# Patient Record
Sex: Male | Born: 2000 | Race: Black or African American | Hispanic: No | Marital: Single
Health system: Southern US, Community
[De-identification: ages and names within clinical notes are randomized; demographics above are authoritative.]

---
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
## 2022-04-20 IMAGING — CR DG WRIST COMPLETE 3+V*R*
4 series · 4 of 4 positions shown · non-contrast
Comparison: None.

CLINICAL DATA: Recent fall with wrist pain, initial encounter

EXAM:
RIGHT WRIST - COMPLETE 3+ VIEW

[x wrist pa right]
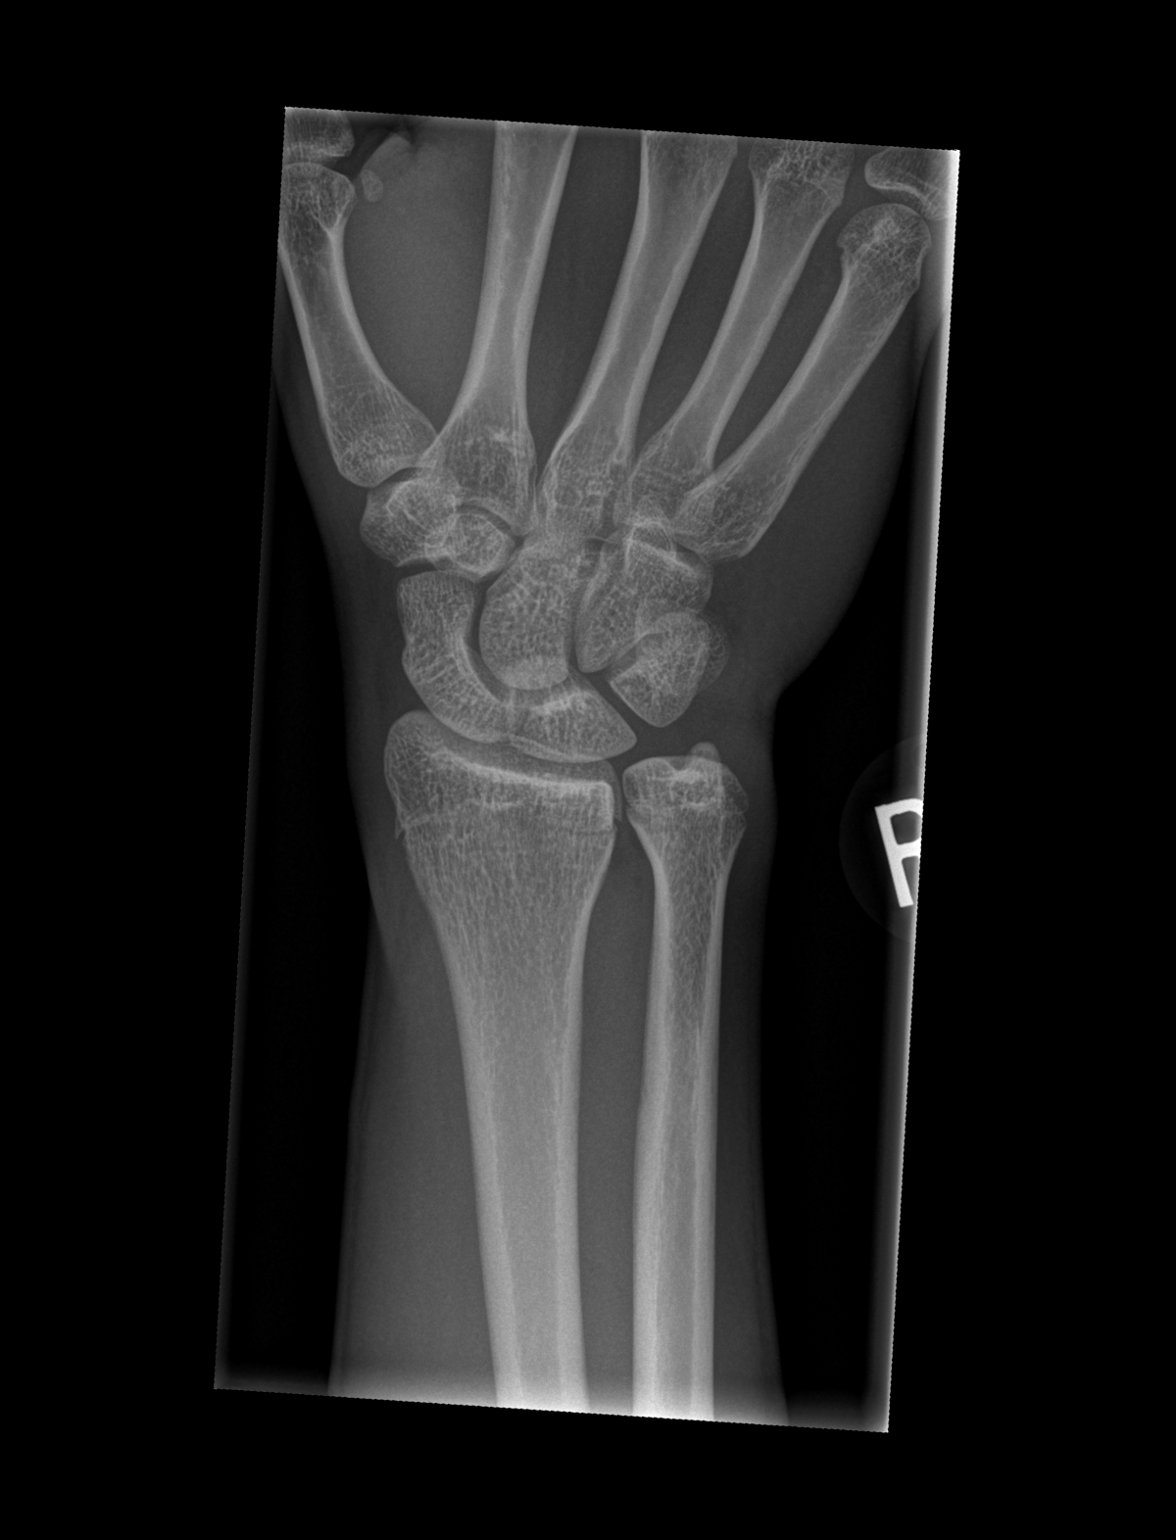

[x wrist obl right]
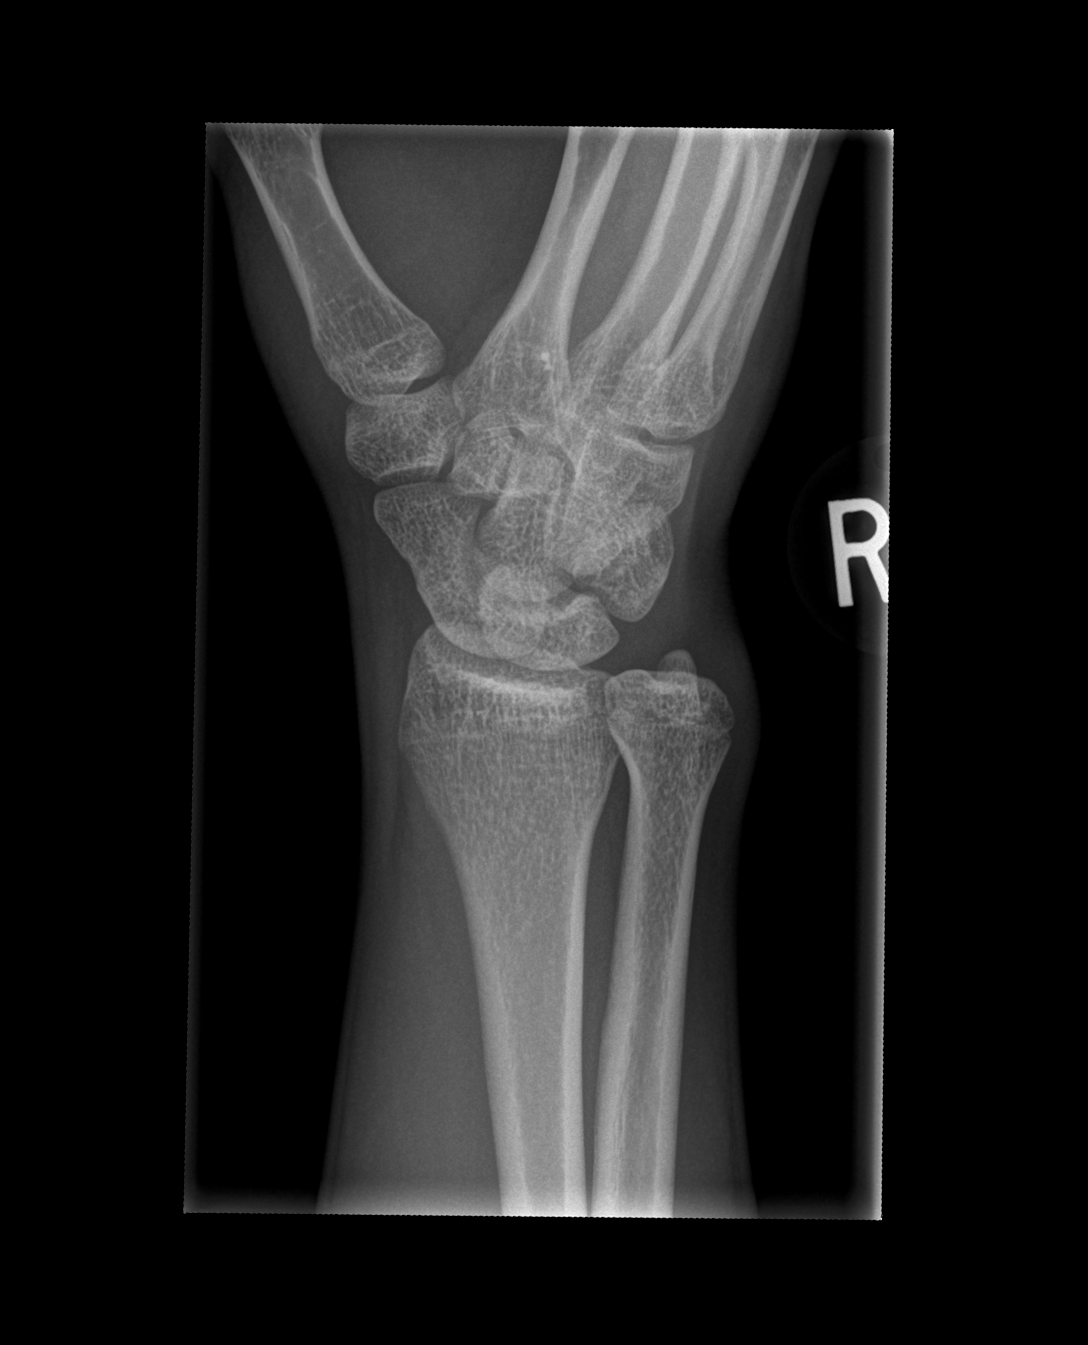

[x wrist lat right]
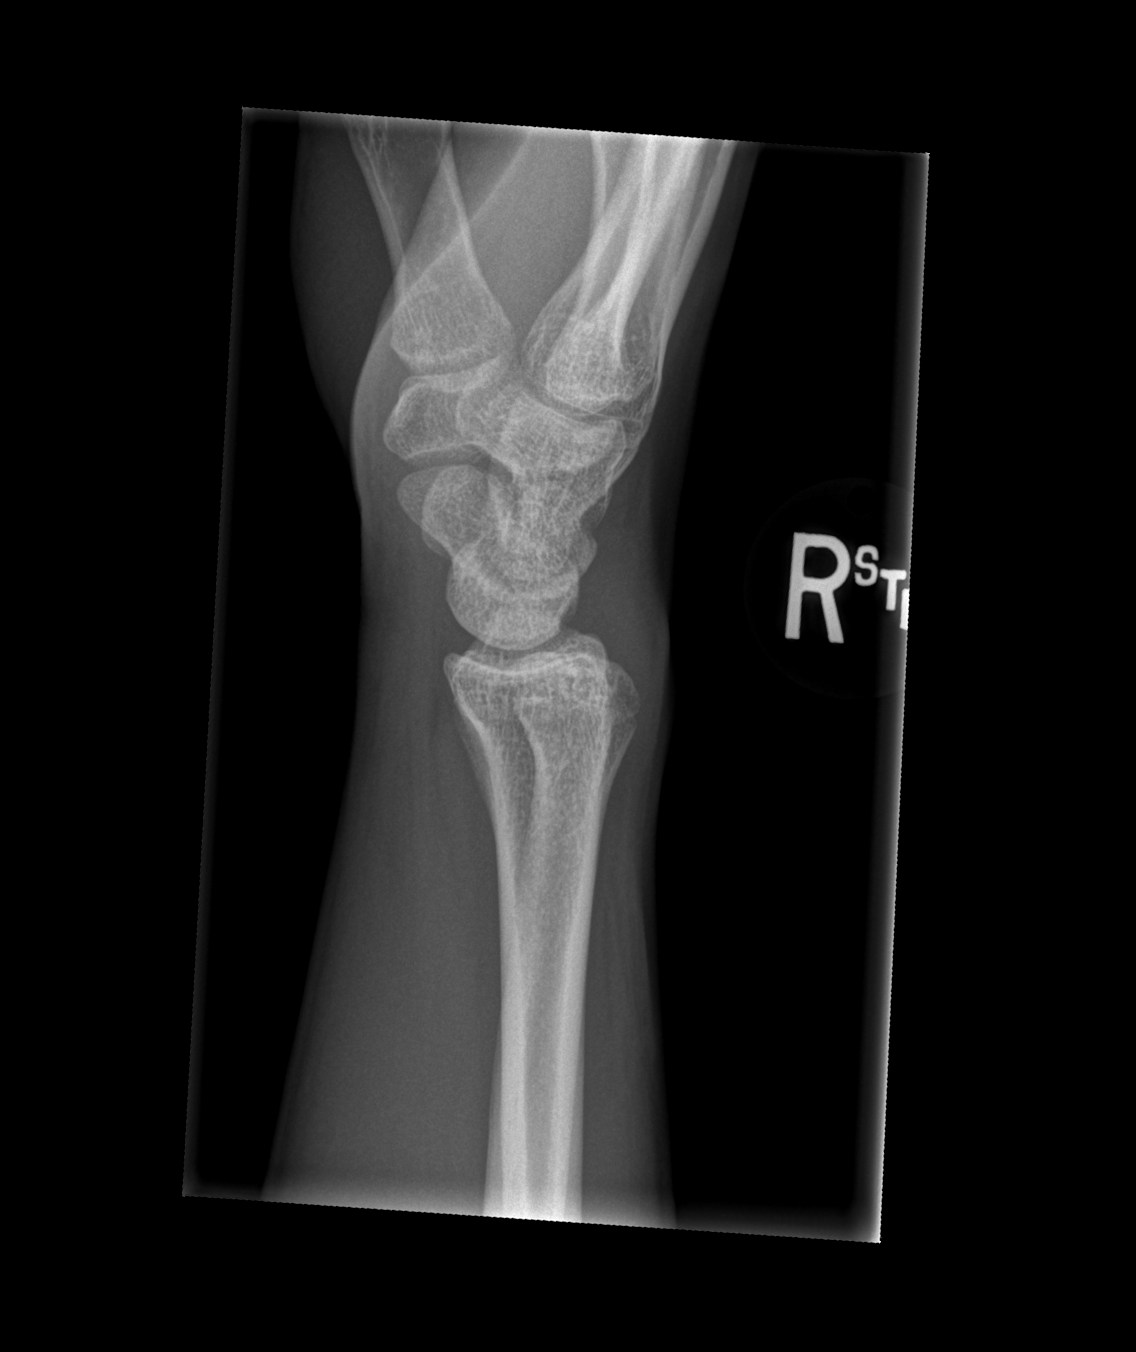

[x wrist navicular view right]
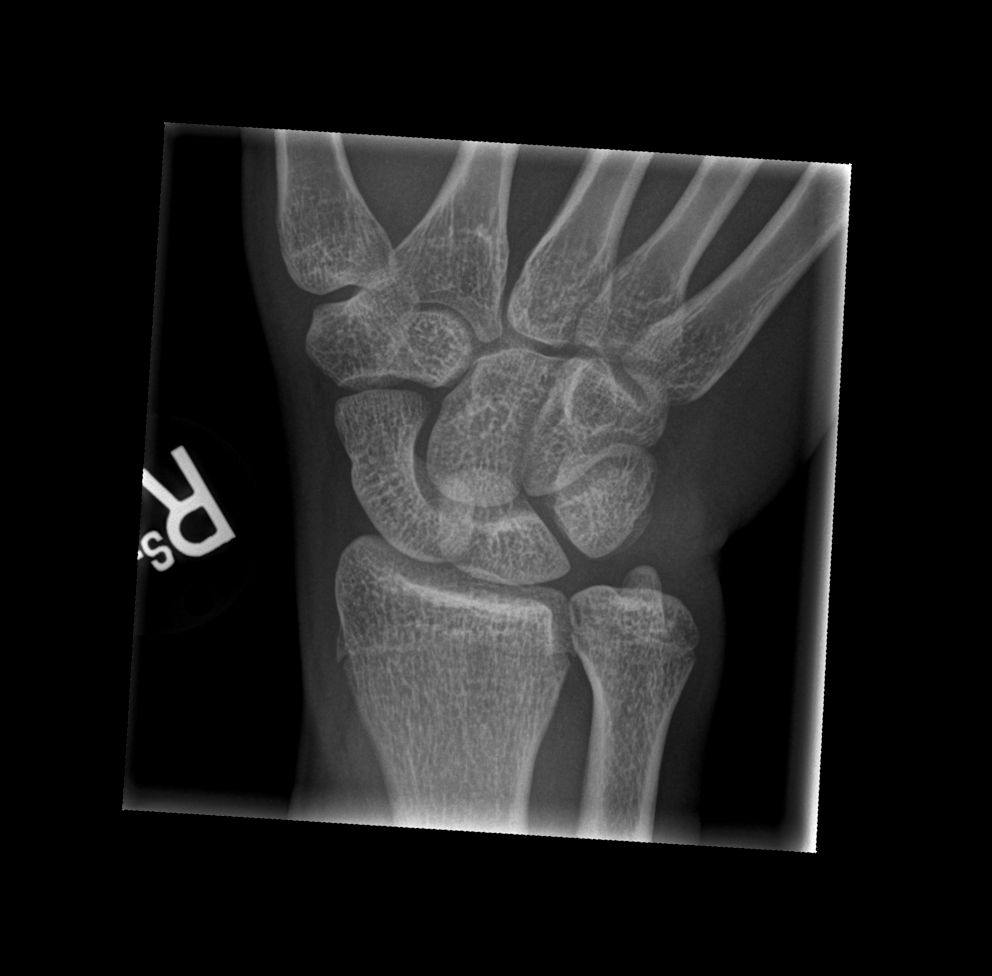

[4 of 4 positions shown; findings below may reference images not displayed]

FINDINGS: Patent growth plates are noted in the distal radius and ulna. No
definitive fracture or dislocation is seen. No gross soft tissue
abnormality is seen.
IMPRESSION: Open growth plates distally without focal abnormality.

## 2023-04-14 ENCOUNTER — Other Ambulatory Visit: Payer: Self-pay

## 2023-04-14 ENCOUNTER — Emergency Department (HOSPITAL_COMMUNITY): Payer: Medicaid Other

## 2023-04-14 ENCOUNTER — Emergency Department (HOSPITAL_COMMUNITY)
Admission: EM | Admit: 2023-04-14 | Discharge: 2023-04-15 | Disposition: A | Discharge status: Home / Self Care | Payer: Medicaid Other | Attending: Emergency Medicine | Admitting: Emergency Medicine

## 2023-04-14 DIAGNOSIS — S40212A Abrasion of left shoulder, initial encounter: Secondary | ICD-10-CM | POA: Insufficient documentation

## 2023-04-14 DIAGNOSIS — T07XXXA Unspecified multiple injuries, initial encounter: Secondary | ICD-10-CM

## 2023-04-14 DIAGNOSIS — S70212A Abrasion, left hip, initial encounter: Secondary | ICD-10-CM | POA: Diagnosis present

## 2023-04-14 DIAGNOSIS — S60512A Abrasion of left hand, initial encounter: Secondary | ICD-10-CM | POA: Diagnosis not present

## 2023-04-14 DIAGNOSIS — Y92410 Unspecified street and highway as the place of occurrence of the external cause: Secondary | ICD-10-CM | POA: Diagnosis not present

## 2023-04-14 DIAGNOSIS — S80211A Abrasion, right knee, initial encounter: Secondary | ICD-10-CM | POA: Diagnosis not present

## 2023-04-14 DIAGNOSIS — S80212A Abrasion, left knee, initial encounter: Secondary | ICD-10-CM | POA: Insufficient documentation

## 2023-04-14 DIAGNOSIS — S40211A Abrasion of right shoulder, initial encounter: Secondary | ICD-10-CM | POA: Insufficient documentation

## 2023-04-14 LAB — I-STAT CHEM 8, ED
BUN: 11 mg/dL (ref 6–20)
Calcium, Ion: 1.09 mmol/L — ABNORMAL LOW (ref 1.15–1.40)
Chloride: 104 mmol/L (ref 98–111)
Creatinine, Ser: 1.1 mg/dL (ref 0.61–1.24)
Glucose, Bld: 130 mg/dL — ABNORMAL HIGH (ref 70–99)
HCT: 49 % (ref 39.0–52.0)
Hemoglobin: 16.7 g/dL (ref 13.0–17.0)
Potassium: 3.7 mmol/L (ref 3.5–5.1)
Sodium: 140 mmol/L (ref 135–145)
TCO2: 25 mmol/L (ref 22–32)

## 2023-04-14 LAB — COMPREHENSIVE METABOLIC PANEL
ALT: 10 U/L (ref 0–44)
AST: 37 U/L (ref 15–41)
Albumin: 4.5 g/dL (ref 3.5–5.0)
Alkaline Phosphatase: 55 U/L (ref 38–126)
Anion gap: 12 (ref 5–15)
BUN: 8 mg/dL (ref 6–20)
CO2: 22 mmol/L (ref 22–32)
Calcium: 9.3 mg/dL (ref 8.9–10.3)
Chloride: 103 mmol/L (ref 98–111)
Creatinine, Ser: 1.23 mg/dL (ref 0.61–1.24)
GFR, Estimated: >60 mL/min (ref >60)
Glucose, Bld: 132 mg/dL — ABNORMAL HIGH (ref 70–99)
Potassium: 3.7 mmol/L (ref 3.5–5.1)
Sodium: 137 mmol/L (ref 135–145)
Total Bilirubin: 1.1 mg/dL (ref 0.3–1.2)
Total Protein: 7.6 g/dL (ref 6.5–8.1)

## 2023-04-14 LAB — CBC
HCT: 46.6 % (ref 39.0–52.0)
Hemoglobin: 15.9 g/dL (ref 13.0–17.0)
MCH: 29.6 pg (ref 26.0–34.0)
MCHC: 34.1 g/dL (ref 30.0–36.0)
MCV: 86.8 fL (ref 80.0–100.0)
Platelets: 227 10*3/uL (ref 150–400)
RBC: 5.37 MIL/uL (ref 4.22–5.81)
RDW: 11.5 % (ref 11.5–15.5)
WBC: 12.2 10*3/uL — ABNORMAL HIGH (ref 4.0–10.5)
nRBC: 0 % (ref 0.0–0.2)

## 2023-04-14 LAB — PROTIME-INR
INR: 1 (ref 0.8–1.2)
Prothrombin Time: 13.8 seconds (ref 11.4–15.2)

## 2023-04-14 LAB — ETHANOL: Alcohol, Ethyl (B): <10 mg/dL (ref <10)

## 2023-04-14 LAB — SAMPLE TO BLOOD BANK

## 2023-04-14 LAB — I-STAT CG4 LACTIC ACID, ED: Lactic Acid, Venous: 1.8 mmol/L (ref 0.5–1.9)

## 2023-04-14 MED ORDER — LIDOCAINE HCL URETHRAL/MUCOSAL 2 % EX GEL
1.0000 | Freq: Once | CUTANEOUS | Status: AC
  Administered 2023-04-14: 1 via TOPICAL
  Filled 2023-04-14: qty 22

## 2023-04-14 MED ORDER — CEFAZOLIN SODIUM-DEXTROSE 2-4 GM/100ML-% IV SOLN
2.0000 g | Freq: Once | INTRAVENOUS | Status: AC
  Administered 2023-04-14: 2 g via INTRAVENOUS

## 2023-04-14 MED ORDER — CEPHALEXIN 500 MG PO CAPS: 500.0000 mg | ORAL_CAPSULE | Freq: Four times a day (QID) | ORAL | 0 refills | Status: DC

## 2023-04-14 MED ORDER — IOHEXOL 350 MG/ML SOLN
75.0000 mL | Freq: Once | INTRAVENOUS | Status: AC | PRN
  Administered 2023-04-14: 75 mL via INTRAVENOUS

## 2023-04-14 MED ORDER — HYDROMORPHONE HCL 1 MG/ML IJ SOLN
1.0000 mg | INTRAMUSCULAR | Status: AC | PRN
  Administered 2023-04-14 – 2023-04-15 (×3): 1 mg via INTRAVENOUS
  Filled 2023-04-14 (×2): qty 1

## 2023-04-14 MED ORDER — SODIUM CHLORIDE 0.9 % IV BOLUS
1000.0000 mL | Freq: Once | INTRAVENOUS | Status: AC
  Administered 2023-04-14: 1000 mL via INTRAVENOUS

## 2023-04-14 MED ORDER — NAPROXEN 375 MG PO TABS: 375.0000 mg | ORAL_TABLET | Freq: Two times a day (BID) | ORAL | 0 refills | Status: DC

## 2023-04-14 MED ORDER — SODIUM CHLORIDE 0.9 % IV SOLN: INTRAVENOUS | Status: DC

## 2023-04-14 MED ORDER — HYDROMORPHONE HCL 1 MG/ML IJ SOLN
INTRAMUSCULAR | Status: AC
  Filled 2023-04-14: qty 1

## 2023-04-14 MED ORDER — OXYCODONE-ACETAMINOPHEN 5-325 MG PO TABS: 1.0000 | ORAL_TABLET | Freq: Four times a day (QID) | ORAL | 0 refills | Status: DC | PRN

## 2023-04-14 MED ORDER — ONDANSETRON HCL 4 MG/2ML IJ SOLN
4.0000 mg | Freq: Once | INTRAMUSCULAR | Status: AC
  Administered 2023-04-14: 4 mg via INTRAVENOUS
  Filled 2023-04-14: qty 2
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
## 2023-04-14 NOTE — ED Notes (Signed)
Patient transported to CT 
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
## 2023-04-14 NOTE — Discharge Instructions (Addendum)
Follow-up at the wound care center to assist with treatment of your wounds.  Continue to apply antibiotic ointment and sterile dressings to the wound daily.  Take the medications as needed for pain.  Consider following up with orthopedic doctor if you have any persistent joint or extremity pain
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
## 2023-05-02 ENCOUNTER — Ambulatory Visit: Payer: Medicaid Other | Admitting: Physician Assistant

## 2023-05-03 ENCOUNTER — Ambulatory Visit (HOSPITAL_BASED_OUTPATIENT_CLINIC_OR_DEPARTMENT_OTHER): Payer: Medicaid Other | Admitting: Orthopaedic Surgery

## 2023-05-03 DIAGNOSIS — T148XXA Other injury of unspecified body region, initial encounter: Secondary | ICD-10-CM
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
## 2023-05-08 ENCOUNTER — Ambulatory Visit: Payer: Medicaid Other | Admitting: Internal Medicine

## 2023-10-23 ENCOUNTER — Encounter (HOSPITAL_COMMUNITY): Payer: Self-pay

## 2023-10-23 ENCOUNTER — Other Ambulatory Visit: Payer: Self-pay

## 2023-10-23 ENCOUNTER — Emergency Department (HOSPITAL_COMMUNITY)

## 2023-10-23 ENCOUNTER — Observation Stay (HOSPITAL_COMMUNITY)
Admission: EM | Admit: 2023-10-23 | Discharge: 2023-10-24 | Disposition: A | Discharge status: Home / Self Care | Attending: Surgery | Admitting: Surgery

## 2023-10-23 DIAGNOSIS — S27322A Contusion of lung, bilateral, initial encounter: Secondary | ICD-10-CM | POA: Diagnosis not present

## 2023-10-23 DIAGNOSIS — T1490XA Injury, unspecified, initial encounter: Secondary | ICD-10-CM | POA: Diagnosis present

## 2023-10-23 DIAGNOSIS — M62059 Separation of muscle (nontraumatic), unspecified thigh: Secondary | ICD-10-CM | POA: Diagnosis not present

## 2023-10-23 DIAGNOSIS — S61211A Laceration without foreign body of left index finger without damage to nail, initial encounter: Secondary | ICD-10-CM | POA: Insufficient documentation

## 2023-10-23 DIAGNOSIS — S59292A Other physeal fracture of lower end of radius, left arm, initial encounter for closed fracture: Secondary | ICD-10-CM | POA: Diagnosis not present

## 2023-10-23 DIAGNOSIS — R109 Unspecified abdominal pain: Secondary | ICD-10-CM | POA: Diagnosis not present

## 2023-10-23 DIAGNOSIS — R519 Headache, unspecified: Secondary | ICD-10-CM | POA: Diagnosis present

## 2023-10-23 DIAGNOSIS — Z7722 Contact with and (suspected) exposure to environmental tobacco smoke (acute) (chronic): Secondary | ICD-10-CM | POA: Diagnosis not present

## 2023-10-23 LAB — I-STAT CHEM 8, ED
BUN: 11 mg/dL (ref 6–20)
Calcium, Ion: 1.14 mmol/L — ABNORMAL LOW (ref 1.15–1.40)
Chloride: 101 mmol/L (ref 98–111)
Creatinine, Ser: 1.2 mg/dL (ref 0.61–1.24)
Glucose, Bld: 108 mg/dL — ABNORMAL HIGH (ref 70–99)
HCT: 45 % (ref 39.0–52.0)
Hemoglobin: 15.3 g/dL (ref 13.0–17.0)
Potassium: 3.5 mmol/L (ref 3.5–5.1)
Sodium: 140 mmol/L (ref 135–145)
TCO2: 28 mmol/L (ref 22–32)

## 2023-10-23 LAB — URINALYSIS, ROUTINE W REFLEX MICROSCOPIC
Bilirubin Urine: NEGATIVE
Glucose, UA: NEGATIVE mg/dL
Ketones, ur: NEGATIVE mg/dL
Leukocytes,Ua: NEGATIVE
Nitrite: NEGATIVE
Protein, ur: NEGATIVE mg/dL
RBC / HPF: >50 RBC/hpf (ref 0–5)
Specific Gravity, Urine: >1.046 — ABNORMAL HIGH (ref 1.005–1.030)
pH: 8 (ref 5.0–8.0)

## 2023-10-23 LAB — COMPREHENSIVE METABOLIC PANEL WITH GFR
ALT: 16 U/L (ref 0–44)
AST: 33 U/L (ref 15–41)
Albumin: 4.2 g/dL (ref 3.5–5.0)
Alkaline Phosphatase: 53 U/L (ref 38–126)
Anion gap: 10 (ref 5–15)
BUN: 10 mg/dL (ref 6–20)
CO2: 25 mmol/L (ref 22–32)
Calcium: 9.3 mg/dL (ref 8.9–10.3)
Chloride: 104 mmol/L (ref 98–111)
Creatinine, Ser: 1.22 mg/dL (ref 0.61–1.24)
GFR, Estimated: >60 mL/min (ref >60)
Glucose, Bld: 108 mg/dL — ABNORMAL HIGH (ref 70–99)
Potassium: 3.2 mmol/L — ABNORMAL LOW (ref 3.5–5.1)
Sodium: 139 mmol/L (ref 135–145)
Total Bilirubin: 0.8 mg/dL (ref 0.0–1.2)
Total Protein: 7.1 g/dL (ref 6.5–8.1)

## 2023-10-23 LAB — CBC
HCT: 45.6 % (ref 39.0–52.0)
Hemoglobin: 15.9 g/dL (ref 13.0–17.0)
MCH: 30.3 pg (ref 26.0–34.0)
MCHC: 34.9 g/dL (ref 30.0–36.0)
MCV: 87 fL (ref 80.0–100.0)
Platelets: 186 10*3/uL (ref 150–400)
RBC: 5.24 MIL/uL (ref 4.22–5.81)
RDW: 11.4 % — ABNORMAL LOW (ref 11.5–15.5)
WBC: 4.5 10*3/uL (ref 4.0–10.5)
nRBC: 0 % (ref 0.0–0.2)

## 2023-10-23 LAB — PROTIME-INR
INR: 1 (ref 0.8–1.2)
Prothrombin Time: 13.6 s (ref 11.4–15.2)

## 2023-10-23 LAB — I-STAT CG4 LACTIC ACID, ED: Lactic Acid, Venous: 1.6 mmol/L (ref 0.5–1.9)

## 2023-10-23 LAB — SAMPLE TO BLOOD BANK

## 2023-10-23 LAB — ETHANOL: Alcohol, Ethyl (B): <10 mg/dL (ref <10)

## 2023-10-23 MED ORDER — MORPHINE SULFATE (PF) 4 MG/ML IV SOLN
4.0000 mg | Freq: Once | INTRAVENOUS | Status: AC
  Administered 2023-10-23: 4 mg via INTRAVENOUS
  Filled 2023-10-23: qty 1

## 2023-10-23 MED ORDER — ACETAMINOPHEN 500 MG PO TABS
1000.0000 mg | ORAL_TABLET | Freq: Four times a day (QID) | ORAL | Status: DC
  Administered 2023-10-24 (×3): 1000 mg via ORAL
  Filled 2023-10-23 (×3): qty 2

## 2023-10-23 MED ORDER — OXYCODONE HCL 5 MG PO TABS: 5.0000 mg | ORAL_TABLET | ORAL | Status: DC | PRN

## 2023-10-23 MED ORDER — METHOCARBAMOL 1000 MG/10ML IJ SOLN
500.0000 mg | Freq: Three times a day (TID) | INTRAMUSCULAR | Status: DC
  Filled 2023-10-23: qty 10

## 2023-10-23 MED ORDER — HYDROMORPHONE HCL 1 MG/ML IJ SOLN: 1.0000 mg | INTRAMUSCULAR | Status: DC | PRN

## 2023-10-23 MED ORDER — ENOXAPARIN SODIUM 30 MG/0.3ML IJ SOSY
30.0000 mg | PREFILLED_SYRINGE | Freq: Two times a day (BID) | INTRAMUSCULAR | Status: DC
  Administered 2023-10-24: 30 mg via SUBCUTANEOUS
  Filled 2023-10-23: qty 0.3

## 2023-10-23 MED ORDER — GABAPENTIN 300 MG PO CAPS
300.0000 mg | ORAL_CAPSULE | Freq: Three times a day (TID) | ORAL | Status: DC
  Administered 2023-10-24 (×3): 300 mg via ORAL
  Filled 2023-10-23 (×3): qty 1

## 2023-10-23 MED ORDER — POLYETHYLENE GLYCOL 3350 17 G PO PACK: 17.0000 g | PACK | Freq: Every day | ORAL | Status: DC | PRN

## 2023-10-23 MED ORDER — HYDRALAZINE HCL 20 MG/ML IJ SOLN: 10.0000 mg | INTRAMUSCULAR | Status: DC | PRN

## 2023-10-23 MED ORDER — ONDANSETRON 4 MG PO TBDP: 4.0000 mg | ORAL_TABLET | Freq: Four times a day (QID) | ORAL | Status: DC | PRN

## 2023-10-23 MED ORDER — ONDANSETRON HCL 4 MG/2ML IJ SOLN: 4.0000 mg | Freq: Four times a day (QID) | INTRAMUSCULAR | Status: DC | PRN

## 2023-10-23 MED ORDER — HYDROMORPHONE HCL 1 MG/ML IJ SOLN
1.0000 mg | Freq: Once | INTRAMUSCULAR | Status: AC
  Administered 2023-10-23: 1 mg via INTRAVENOUS
  Filled 2023-10-23: qty 1

## 2023-10-23 MED ORDER — IOHEXOL 350 MG/ML SOLN
75.0000 mL | Freq: Once | INTRAVENOUS | Status: AC | PRN
  Administered 2023-10-23: 75 mL via INTRAVENOUS

## 2023-10-23 MED ORDER — SODIUM CHLORIDE 0.9 % IV BOLUS
1000.0000 mL | Freq: Once | INTRAVENOUS | Status: AC
  Administered 2023-10-23: 1000 mL via INTRAVENOUS

## 2023-10-23 MED ORDER — METHOCARBAMOL 500 MG PO TABS
500.0000 mg | ORAL_TABLET | Freq: Three times a day (TID) | ORAL | Status: DC
  Administered 2023-10-24 (×2): 500 mg via ORAL
  Filled 2023-10-23 (×3): qty 1

## 2023-10-23 MED ORDER — DOCUSATE SODIUM 100 MG PO CAPS
100.0000 mg | ORAL_CAPSULE | Freq: Two times a day (BID) | ORAL | Status: DC
  Filled 2023-10-23 (×2): qty 1

## 2023-10-23 NOTE — ED Notes (Signed)
 Patient transported to CT
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
## 2023-10-24 ENCOUNTER — Other Ambulatory Visit (HOSPITAL_COMMUNITY): Payer: Self-pay

## 2023-10-24 ENCOUNTER — Observation Stay (HOSPITAL_COMMUNITY)

## 2023-10-24 LAB — COMPREHENSIVE METABOLIC PANEL WITH GFR
ALT: 16 U/L (ref 0–44)
AST: 31 U/L (ref 15–41)
Albumin: 3.5 g/dL (ref 3.5–5.0)
Alkaline Phosphatase: 40 U/L (ref 38–126)
Anion gap: 10 (ref 5–15)
BUN: 9 mg/dL (ref 6–20)
CO2: 26 mmol/L (ref 22–32)
Calcium: 8.7 mg/dL — ABNORMAL LOW (ref 8.9–10.3)
Chloride: 102 mmol/L (ref 98–111)
Creatinine, Ser: 0.99 mg/dL (ref 0.61–1.24)
GFR, Estimated: >60 mL/min (ref >60)
Glucose, Bld: 103 mg/dL — ABNORMAL HIGH (ref 70–99)
Potassium: 3.7 mmol/L (ref 3.5–5.1)
Sodium: 138 mmol/L (ref 135–145)
Total Bilirubin: 1 mg/dL (ref 0.0–1.2)
Total Protein: 6.2 g/dL — ABNORMAL LOW (ref 6.5–8.1)

## 2023-10-24 LAB — CBC
HCT: 39.2 % (ref 39.0–52.0)
Hemoglobin: 13.5 g/dL (ref 13.0–17.0)
MCH: 30.4 pg (ref 26.0–34.0)
MCHC: 34.4 g/dL (ref 30.0–36.0)
MCV: 88.3 fL (ref 80.0–100.0)
Platelets: 162 10*3/uL (ref 150–400)
RBC: 4.44 MIL/uL (ref 4.22–5.81)
RDW: 11.5 % (ref 11.5–15.5)
WBC: 8.3 10*3/uL (ref 4.0–10.5)
nRBC: 0 % (ref 0.0–0.2)

## 2023-10-24 LAB — HIV ANTIBODY (ROUTINE TESTING W REFLEX): HIV Screen 4th Generation wRfx: NONREACTIVE

## 2023-10-24 MED ORDER — HYDROMORPHONE HCL 1 MG/ML IJ SOLN: 0.5000 mg | INTRAMUSCULAR | Status: DC | PRN

## 2023-10-24 MED ORDER — OXYCODONE HCL 5 MG PO TABS
5.0000 mg | ORAL_TABLET | ORAL | Status: DC | PRN
Start: 2023-10-24 — End: 2023-10-24

## 2023-10-24 MED ORDER — ACETAMINOPHEN 500 MG PO TABS: 1000.0000 mg | ORAL_TABLET | Freq: Four times a day (QID) | ORAL | Status: AC | PRN

## 2023-10-24 MED ORDER — METHOCARBAMOL 500 MG PO TABS
1000.0000 mg | ORAL_TABLET | Freq: Three times a day (TID) | ORAL | 0 refills | Status: AC | PRN
  Filled 2023-10-24: qty 60, 10d supply, fill #0

## 2023-10-24 MED ORDER — OXYCODONE HCL 5 MG PO TABS: 5.0000 mg | ORAL_TABLET | ORAL | Status: DC | PRN

## 2023-10-24 MED ORDER — GABAPENTIN 300 MG PO CAPS
300.0000 mg | ORAL_CAPSULE | Freq: Three times a day (TID) | ORAL | 0 refills | Status: AC | PRN
Start: 2023-10-24 — End: ?
  Filled 2023-10-24: qty 30, 10d supply, fill #0

## 2023-10-24 MED ORDER — OXYCODONE HCL 5 MG PO TABS
5.0000 mg | ORAL_TABLET | ORAL | 0 refills | Status: AC | PRN
Start: 2023-10-24 — End: ?
  Filled 2023-10-24: qty 20, 2d supply, fill #0

## 2023-10-24 MED ORDER — METHOCARBAMOL 500 MG PO TABS
1000.0000 mg | ORAL_TABLET | Freq: Three times a day (TID) | ORAL | Status: DC
  Administered 2023-10-24: 1000 mg via ORAL
  Filled 2023-10-24: qty 2
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
## 2023-10-24 NOTE — Progress Notes (Signed)
 Orthopedic Tech Progress Note Patient Details:  Roger Nolan 2000/09/28 [id number]  Ortho Devices Type of Ortho Device: Velcro wrist splint Ortho Device/Splint Location: LUE Ortho Device/Splint Interventions: Ordered, Application, Removal (volar splint removed)   Post Interventions Patient Tolerated: Well Instructions Provided: Care of device, Adjustment of device  [name] 10/24/2023, 12:13 PM
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
## 2023-10-24 NOTE — Discharge Instructions (Addendum)
 Nonweightbearing on your wrist.  May weightbear as tolerates through your elbow.  Weightbear as tolerates on both lower extremities
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
## 2023-10-24 NOTE — Plan of Care (Signed)

## 2023-10-24 NOTE — Progress Notes (Signed)
 Orthopedic Tech Progress Note Patient Details:  Roger Nolan 09-09-2000 [id number]  Ortho Devices Type of Ortho Device: Short arm splint Ortho Device/Splint Location: LUE Ortho Device/Splint Interventions: Ordered, Application, Adjustment   Post Interventions Patient Tolerated: Well Instructions Provided: Care of device  Tonye Pearson 10/24/2023, 2:43 AM

## 2024-08-07 ENCOUNTER — Other Ambulatory Visit: Payer: Self-pay

## 2024-08-07 ENCOUNTER — Emergency Department (HOSPITAL_COMMUNITY)

## 2024-08-07 ENCOUNTER — Encounter (HOSPITAL_COMMUNITY): Payer: Self-pay

## 2024-08-07 ENCOUNTER — Emergency Department (HOSPITAL_COMMUNITY)
Admission: EM | Admit: 2024-08-07 | Discharge: 2024-08-07 | Disposition: A | Discharge status: Home / Self Care | Attending: Emergency Medicine | Admitting: Emergency Medicine

## 2024-08-07 DIAGNOSIS — R519 Headache, unspecified: Secondary | ICD-10-CM | POA: Diagnosis present

## 2024-08-07 NOTE — Discharge Instructions (Signed)
 Today's CT scan was reassuring, there are no abnormalities identified on it.  Our neurology colleagues will contact you in the coming days for follow-up.  In the interim, monitor your condition carefully and do not hesitate to return here if needed.

## 2024-08-07 NOTE — ED Notes (Signed)
 Pt back from CT at this time

## 2024-08-07 NOTE — ED Provider Notes (Signed)
 " [facility] AT [facility] Provider Note   CSN: [id number] Arrival date & time: 08/07/24  1101     Patient presents with: Headache   Roger Nolan is a 24 y.o. male.   HPI Patient presents with his girlfriend who assist with history.  He notes a history of intermittent headaches going back years, worse recently with specific insert about possible tumor today. Headache is typically left-sided, with occasional radiation to the right.  He has seen a neurologist in the very distant past.  He has minimal relief with ibuprofen  or Tylenol  currently.  Headache was more severe today, is currently improved. No associated vision changes, nausea, vomiting, behavioral changes, focal weakness.    Prior to Admission medications  Medication Sig Start Date End Date Taking? Authorizing Provider  acetaminophen  (TYLENOL ) 500 MG tablet Take 2 tablets (1,000 mg total) by mouth every 6 (six) hours as needed. 10/24/23   Tammy Sor, PA-C  gabapentin  (NEURONTIN ) 300 MG capsule Take 1 capsule (300 mg total) by mouth 3 (three) times daily as needed. 10/24/23   Tammy Sor, PA-C  methocarbamol  (ROBAXIN ) 500 MG tablet Take 2 tablets (1,000 mg total) by mouth every 8 (eight) hours as needed for muscle spasms. 10/24/23   Tammy Sor, PA-C  oxyCODONE  (OXY IR/ROXICODONE ) 5 MG immediate release tablet Take 1-2 tablets (5-10 mg total) by mouth every 4 (four) hours as needed for moderate pain (pain score 4-6) or severe pain (pain score 7-10). 10/24/23   Tammy Sor, PA-C    Allergies: Patient has no known allergies.    Review of Systems  Updated Vital Signs BP 120/72   Pulse 64   Temp 98 F (36.7 C)   Resp 18   Ht 1.727 m (5' 8)   SpO2 100%   BMI 19.01 kg/m   Physical Exam Vitals and nursing note reviewed.  Constitutional:      General: He is not in acute distress.    Appearance: He is well-developed.  HENT:     Head: Normocephalic and atraumatic.  Eyes:      Conjunctiva/sclera: Conjunctivae normal.  Cardiovascular:     Rate and Rhythm: Normal rate and regular rhythm.  Pulmonary:     Effort: Pulmonary effort is normal. No respiratory distress.     Breath sounds: No stridor.  Abdominal:     General: There is no distension.  Skin:    General: Skin is warm and dry.  Neurological:     Mental Status: He is alert and oriented to person, place, and time.     Cranial Nerves: No cranial nerve deficit or dysarthria.     Motor: No weakness.     Coordination: Coordination normal.  Psychiatric:        Mood and Affect: Mood normal. Mood is not anxious.     (all labs ordered are listed, but only abnormal results are displayed) Labs Reviewed - No data to display  EKG: None  Radiology: CT Head Wo Contrast Result Date: 08/07/2024 EXAM: CT HEAD WITHOUT CONTRAST 08/07/2024 12:06:40 PM TECHNIQUE: CT of the head was performed without the administration of intravenous contrast. Automated exposure control, iterative reconstruction, and/or weight based adjustment of the mA/kV was utilized to reduce the radiation dose to as low as reasonably achievable. COMPARISON: None available. CLINICAL HISTORY: Headache, sudden, severe. Headache, sudden, severe. FINDINGS: BRAIN AND VENTRICLES: No acute hemorrhage. No evidence of acute infarct. No hydrocephalus. No extra-axial collection. No mass effect or midline shift. ORBITS: No acute  abnormality. SINUSES: No acute abnormality. SOFT TISSUES AND SKULL: No acute soft tissue abnormality. No skull fracture. IMPRESSION: 1. Normal. Electronically signed by: Evalene Coho MD 08/07/2024 12:13 PM EST RP Workstation: [id number]     Procedures   Medications Ordered in the ED - No data to display                                  Medical Decision Making An adult male with headache, worsening. Patient's history is notable for headache on back years, no recent evaluation, given his severity, rule out intracranial lesion  performed with CT scan here. Patient's physical exam, vital signs and otherwise vitals reassuring, and the time course is generally reassuring as well. With reassuring head CT here, unremarkable vital signs, patient will follow-up with our neurology colleagues, referral facilitated.  Amount and/or Complexity of Data Reviewed Independent Historian: friend Radiology: ordered and independent interpretation performed. Decision-making details documented in ED Course.  Risk OTC drugs. Decision regarding hospitalization.   Final diagnoses:  Bad headache     Garrick Charleston, MD 08/07/24 1222  "

## 2024-08-07 NOTE — ED Notes (Signed)
 Pt transported to CT at this time.

## 2024-08-07 NOTE — ED Triage Notes (Signed)
 Pt came in via POV d/t bad HA intermittently on the side of his head, sometimes it is on the RT & then can switch over to the Lt side. This has been going on for years (per pt). A/Ox4, denies any current pain, said it went away before he arrived here.

## 2024-08-11 ENCOUNTER — Encounter: Payer: Self-pay | Admitting: Neurology

## 2024-09-29 ENCOUNTER — Ambulatory Visit: Payer: Self-pay | Admitting: Neurology
